# Patient Record
Sex: Male | Born: 1945 | Race: Black or African American | Hispanic: No | Marital: Married | Smoking: Former smoker
Health system: Southern US, Community
[De-identification: ages and names within clinical notes are randomized; demographics above are authoritative.]

## PROBLEM LIST (undated history)

## (undated) DIAGNOSIS — I10 Essential (primary) hypertension: Secondary | ICD-10-CM

## (undated) DIAGNOSIS — I4891 Unspecified atrial fibrillation: Secondary | ICD-10-CM

## (undated) DIAGNOSIS — I5022 Chronic systolic (congestive) heart failure: Secondary | ICD-10-CM

## (undated) HISTORY — DX: Essential (primary) hypertension: I10

## (undated) HISTORY — DX: Chronic systolic (congestive) heart failure: I50.22

## (undated) HISTORY — DX: Unspecified atrial fibrillation: I48.91

---

## 2010-08-22 ENCOUNTER — Inpatient Hospital Stay: Payer: Self-pay | Admitting: Internal Medicine

## 2010-08-22 DIAGNOSIS — R079 Chest pain, unspecified: Secondary | ICD-10-CM

## 2010-08-22 DIAGNOSIS — I4891 Unspecified atrial fibrillation: Secondary | ICD-10-CM

## 2010-08-30 ENCOUNTER — Encounter: Payer: Self-pay | Admitting: Cardiovascular Disease

## 2010-08-30 ENCOUNTER — Ambulatory Visit (INDEPENDENT_AMBULATORY_CARE_PROVIDER_SITE_OTHER): Payer: PRIVATE HEALTH INSURANCE | Admitting: Cardiovascular Disease

## 2010-08-30 DIAGNOSIS — I4891 Unspecified atrial fibrillation: Secondary | ICD-10-CM

## 2010-08-30 DIAGNOSIS — I428 Other cardiomyopathies: Secondary | ICD-10-CM

## 2010-08-30 DIAGNOSIS — I10 Essential (primary) hypertension: Secondary | ICD-10-CM

## 2010-08-30 MED ORDER — AMLODIPINE BESYLATE 10 MG PO TABS: 10.0000 mg | ORAL_TABLET | Freq: Every day | ORAL | Status: AC

## 2010-08-30 MED ORDER — LISINOPRIL 40 MG PO TABS: 40.0000 mg | ORAL_TABLET | Freq: Every day | ORAL | Status: AC

## 2010-08-30 NOTE — Assessment & Plan Note (Signed)
He is maintaining normal sinus rhythm today. He has not appreciated any palpitations. We will continue metoprolol b.i.d.. Discharge notes from hospital do not indicate if he is on tartrate or succinate. We will try to confirm this for our records.  If he continues to hold normal sinus rhythmAfter improvement of his blood pressure, we could consider holding anticoagulation.

## 2010-08-30 NOTE — Assessment & Plan Note (Signed)
Blood pressure is very elevated on today's visit. We will increase lisinopril to 20 mg daily with a check of his blood pressure appeared if it continues to be elevated, we have suggested he increase to 40 mg within several days. We have also provided a prescription for amlodipine 10 mg daily to be taken if his systolic pressures continued to be greater than 140 on a regular basis. He has followup with his new primary care physician in [place]).

## 2010-08-30 NOTE — Patient Instructions (Addendum)
Stay on metoprolol twice a day Please stop the low dose lisinopril Start lisinopril higher dose (you could start a 1/2 pill for a few days and check your blood pressure). If top number is more than 140, then go to the full pill of lisinopril. Continue to check your blood pressure.  If you have top number greater than 140, start the amlodipine one a day Please call us if you have new issues that need to be addressed before your next appt.  We will call you for a follow up Appt. In 3 months

## 2010-08-30 NOTE — Progress Notes (Addendum)
Patient ID: Randy Morris, male    DOB: 1945/12/17, 65 y.o.   MRN: [id number]  HPI Comments: Randy Morris is a pleasant 65 year old gentleman with history of hypertension, recently presented to [facility] on August 22 2010 with chest discomfort and rapid atrial fibrillation. He was started on Cardizem infusion with improvement of his blood pressure and heart rate, conversion to normal sinus rhythm overnight. Additional workup included echocardiogram and Myoview. He presents today to establish care in the office in for evaluation of his medications and blood pressure post hospital.  Overall he reports that he is doing well. He does have mild malaise but is unable to pinpoint the cause. He is uncertain if it is from a medication. He has not been checking his blood pressure at home. He denies any further episodes of chest pain. He does have some discomfort in the left breast with palpation. At times. This comes on at rest   Echocardiogram shows ejection fraction 45-50%, diastolic dysfunction, mild LVH. Normal left atrial size. Normal right ventricular systolic pressures  Stress test shows no significant ischemia. Very small perfusion defect in the distal anterior wall. Ejection fraction 44%. Low risk scan. Patient was in normal sinus rhythm for the test 08/23/2010.  EKG shows normal sinus rhythm with rate 77 beats per minute, no significant ST or T wave changes, biatrial enlargement    Outpatient Encounter Prescriptions as of 08/30/2010  Medication Sig Dispense Refill  . acetaminophen (TYLENOL) 325 MG tablet Take 650 mg by mouth every 6 (six) hours as needed.        . dabigatran (PRADAXA) 150 MG CAPS Take 150 mg by mouth every 12 (twelve) hours.        . metoprolol succinate (TOPROL-XL) 25 MG 24 hr tablet Take 25 mg by mouth 2 (two) times daily.        Marland Kitchen  lisinopril (PRINIVIL,ZESTRIL) 5 MG tablet Take 5 mg by mouth daily.           Review of Systems  Constitutional: Negative.   HENT: Negative.   Eyes:  Negative.   Respiratory: Negative.   Cardiovascular: Negative.   Gastrointestinal: Negative.   Musculoskeletal: Negative.   Skin: Negative.   Neurological: Negative.   Hematological: Negative.   Psychiatric/Behavioral: Negative.   All other systems reviewed and are negative.    BP 180/98  Pulse 77  Ht 6\' 2"  (1.88 m)  Wt 226 lb (102.513 kg)  BMI 29.02 kg/m2  Physical Exam  Nursing note and vitals reviewed. Constitutional: He is oriented to person, place, and time. He appears well-developed and well-nourished.  HENT:  Head: Normocephalic.  Nose: Nose normal.  Mouth/Throat: Oropharynx is clear and moist.  Eyes: Conjunctivae are normal. Pupils are equal, round, and reactive to light.  Neck: Normal range of motion. Neck supple. No JVD present.  Cardiovascular: Normal rate, regular rhythm, S1 normal, S2 normal, normal heart sounds and intact distal pulses.  Exam reveals no gallop and no friction rub.   No murmur heard. Pulmonary/Chest: Effort normal and breath sounds normal. No respiratory distress. He has no wheezes. He has no rales. He exhibits no tenderness.  Abdominal: Soft. Bowel sounds are normal. He exhibits no distension. There is no tenderness.  Musculoskeletal: Normal range of motion. He exhibits no edema and no tenderness.  Lymphadenopathy:    He has no cervical adenopathy.  Neurological: He is alert and oriented to person, place, and time. Coordination normal.  Skin: Skin is warm and dry. No rash noted. No  erythema.  Psychiatric: He has a normal mood and affect. His behavior is normal. Judgment and thought content normal.           Assessment and Plan

## 2010-08-30 NOTE — Assessment & Plan Note (Signed)
Ejection fraction is mildly decreased at 45%. This could be secondary to long-standing hypertension. Unable to exclude changes from recent atrial fibrillation. He does have diastolic dysfunction and mild LVH, again likely from chronic hypertension.

## 2010-12-04 ENCOUNTER — Ambulatory Visit: Payer: PRIVATE HEALTH INSURANCE | Admitting: Cardiovascular Disease

## 2020-09-14 ENCOUNTER — Other Ambulatory Visit: Payer: Self-pay

## 2020-09-14 ENCOUNTER — Ambulatory Visit
Admission: EM | Admit: 2020-09-14 | Discharge: 2020-09-14 | Disposition: A | Discharge status: Home / Self Care | Payer: Medicare Other | Attending: Family Medicine | Admitting: Family Medicine

## 2020-09-14 ENCOUNTER — Ambulatory Visit (INDEPENDENT_AMBULATORY_CARE_PROVIDER_SITE_OTHER): Payer: Medicare Other

## 2020-09-14 DIAGNOSIS — M25531 Pain in right wrist: Secondary | ICD-10-CM | POA: Diagnosis not present

## 2020-09-14 MED ORDER — PREDNISONE 10 MG (21) PO TBPK: ORAL_TABLET | ORAL | 0 refills | Status: AC

## 2020-09-14 NOTE — Discharge Instructions (Addendum)
Rest, ice.  Medication as prescribed.  Take care  Dr. Samwise Eckardt  

## 2020-09-14 NOTE — ED Provider Notes (Signed)
[facility]    CSN: [id number] Arrival date & time: 09/14/20  1147      History   Chief Complaint Wrist pain  HPI 75 year old male presents with right wrist pain.  Started 2 to 3 weeks ago.  Improved with over-the-counter treatment and then recurred again last night.  He states that his pain was severe last night and has continued today.  Pain 8/10 in severity.  No known inciting factor.  He states that he is very active and does a lot of pull-ups and push-ups.  He also states that it few weeks ago he "caught himself" with his right wrist.  Denies fall.  No bruising.  He states that the wrist is swollen.  He has tried over-the-counter medication both topical and oral without relief.  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 Authorizing Provider  acetaminophen (TYLENOL) 325 MG tablet Take 650 mg by mouth every 6 (six) hours as needed.     Yes [provider]  predniSONE (STERAPRED UNI-PAK 21 TAB) 10 MG (21) TBPK tablet 6 tablets on day 1; decrease by 1 tablet daily until gone. 09/14/20  Yes Reann Dobias G, DO  amLODipine (NORVASC) 10 MG tablet Take 1 tablet (10 mg total) by mouth daily. 08/30/10 08/30/11  Antonieta Iba, MD  lisinopril (PRINIVIL,ZESTRIL) 40 MG tablet Take 1 tablet (40 mg total) by mouth daily. 08/30/10 08/30/11  Antonieta Iba, MD   Social History Social History   Tobacco Use   Smoking status: Former    Packs/day: 1.00    Years: 10.00    Pack years: 10.00    Types: Cigarettes    Quit date: 02/20/1980    Years since quitting: 40.5   Smokeless tobacco: Never  Substance Use Topics    Alcohol use: No   Drug use: No     Allergies   Patient has no known allergies.   Review of Systems Review of Systems Per HPI   Physical Exam Triage Vital Signs ED Triage Vitals  Enc Vitals Group     BP --      Pulse Rate 09/14/20 1213 (!) 104     Resp 09/14/20 1213 18     Temp 09/14/20 1213 97.8 F (36.6 C)     Temp src --      SpO2 09/14/20 1213 95 %     Weight 09/14/20 1212 230 lb (104.3 kg)     Height 09/14/20 1212 6\' 2"  (1.88 m)     Head Circumference --      Peak Flow --      Pain Score 09/14/20 1211 8     Pain Loc --      Pain Edu? --      Excl. in GC? --    Updated Vital Signs Pulse (!) 104   Temp 97.8 F (36.6 C)   Resp 18   Ht 6\' 2"  (1.88 m)   Wt 104.3 kg   SpO2 95%   BMI 29.53 kg/m   Visual Acuity Right Eye Distance:   Left Eye Distance:  Bilateral Distance:    Right Eye Near:   Left Eye Near:    Bilateral Near:     Physical Exam Vitals and nursing note reviewed.  Constitutional:      General: He is not in acute distress.    Appearance: Normal appearance. He is not ill-appearing.  HENT:     Head: Normocephalic and atraumatic.  Pulmonary:     Effort: Pulmonary effort is normal. No respiratory distress.  Musculoskeletal:     Comments: Right wrist -tenderness over the dorsum of the right wrist.  Mild erythema.  Mild swelling.  Neurological:     Mental Status: He is alert.  Psychiatric:        Mood and Affect: Mood normal.        Behavior: Behavior normal.    UC Treatments / Results  Labs (all labs ordered are listed, but only abnormal results are displayed) Labs Reviewed - No data to display  EKG   Radiology DG Wrist Complete Right  Result Date: 09/14/2020 CLINICAL DATA:  Wrist pain EXAM: RIGHT WRIST - COMPLETE 3+ VIEW COMPARISON:  None. FINDINGS: Tiny osseous density along the dorsal aspect of the proximal carpal row seen on lateral view suggestive of a triquetral avulsion fracture, age indeterminate. Elsewhere, no additional  fracture. No malalignment. Small subcortical lucency along the ulnar aspect of the lunate which may reflect a subchondral cyst or intraosseous ganglion. Joint spaces are relatively well preserved. Mild soft tissue swelling about the wrist. IMPRESSION: 1. Tiny osseous density along the dorsal aspect of the proximal carpal row seen on lateral view suggestive of a triquetral avulsion fracture, age indeterminate. Correlation with point tenderness at this site is recommended. 2. Otherwise, no acute findings. Electronically Signed   By: Duanne Guess D.O.   On: 09/14/2020 13:25    Procedures Procedures (including critical care time)  Medications Ordered in UC Medications - No data to display  Initial Impression / Assessment and Plan / UC Course  I have reviewed the triage vital signs and the nursing notes.  Pertinent labs & imaging results that were available during my care of the patient were reviewed by me and considered in my medical decision making (see chart for details).    75 year old male presents with right wrist pain.  X-ray was obtained.  I did not appreciate any acute fracture.  There was a tiny osseous density that radiology commented about as a possible triquetral avulsion.  Age-indeterminate.  Advise rest, ice, elevation.  Prednisone as prescribed.  Final Clinical Impressions(s) / UC Diagnoses   Final diagnoses:  Right wrist pain     Discharge Instructions      Rest, ice.  Medication as prescribed.  Take care  Dr. Adriana Simas    ED Prescriptions     Medication Sig Dispense Auth. Provider   predniSONE (STERAPRED UNI-PAK 21 TAB) 10 MG (21) TBPK tablet 6 tablets on day 1; decrease by 1 tablet daily until gone. 21 tablet Everlene Other G, DO      PDMP not reviewed this encounter.   Tommie Sams, [place] 09/14/20 1354

## 2020-09-14 NOTE — ED Triage Notes (Signed)
Pt here with C/O right wrist pain, for a few weeks ago, was able to [place] a cream and it went away, started again last night. Pt took difulecnec this morning with little relief. Does remember a fall a couple weeks ago and he caought himseld with hi right hand.

## 2020-12-20 ENCOUNTER — Other Ambulatory Visit: Payer: Self-pay | Admitting: Orthopedic Surgery

## 2020-12-20 DIAGNOSIS — S63501A Unspecified sprain of right wrist, initial encounter: Secondary | ICD-10-CM

## 2020-12-20 DIAGNOSIS — M25331 Other instability, right wrist: Secondary | ICD-10-CM

## 2020-12-20 DIAGNOSIS — M25541 Pain in joints of right hand: Secondary | ICD-10-CM

## 2020-12-27 ENCOUNTER — Ambulatory Visit
Admission: RE | Admit: 2020-12-27 | Discharge: 2020-12-27 | Disposition: A | Discharge status: Home / Self Care | Payer: Medicare Other | Source: Ambulatory Visit | Attending: Orthopedic Surgery | Admitting: Orthopedic Surgery

## 2020-12-27 ENCOUNTER — Other Ambulatory Visit: Payer: Self-pay

## 2020-12-27 DIAGNOSIS — M25331 Other instability, right wrist: Secondary | ICD-10-CM

## 2020-12-27 DIAGNOSIS — S63501A Unspecified sprain of right wrist, initial encounter: Secondary | ICD-10-CM | POA: Diagnosis present

## 2020-12-27 DIAGNOSIS — M25541 Pain in joints of right hand: Secondary | ICD-10-CM | POA: Diagnosis present

## 2022-07-14 IMAGING — MR MR WRIST*R* W/O CM
5 of 6 series · 28 of 40 positions shown · non-contrast
Comparison: X-ray wrist 09/14/2020.

CLINICAL DATA: Right medial wrist pain with swelling, but improving
since [REDACTED] related to a fall.

EXAM:
MR OF THE RIGHT WRIST WITHOUT CONTRAST
TECHNIQUE: Multiplanar, multisequence MR imaging of the right wrist was
performed. No intravenous contrast was administered.

[Series 3: T2 fat-sat · axial · 3.0mm · 0.39mm/px · z∈[-55,+29]mm · 7 of 27 slices shown (1 of 2)]
[im 1/27]
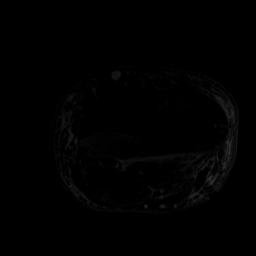
[im 5/27]
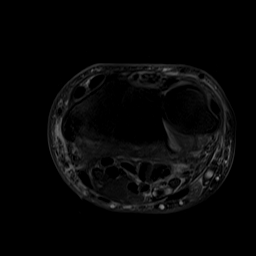
[im 9/27]
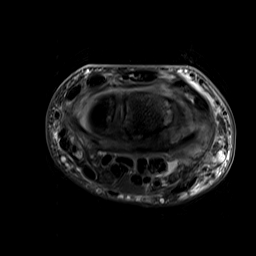
[im 14/27]
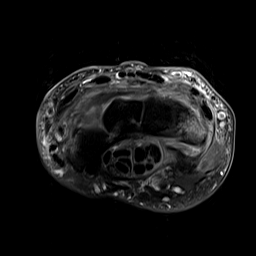
[im 18/27]
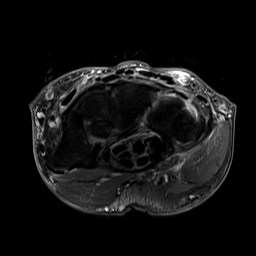
[im 22/27]
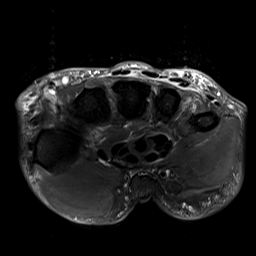
[im 27/27]
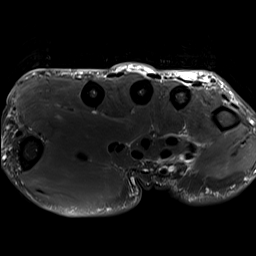

[Series 4: T1 · axial · 3.0mm · 0.31mm/px · 1 of 27 slices shown]
[im 1/27]
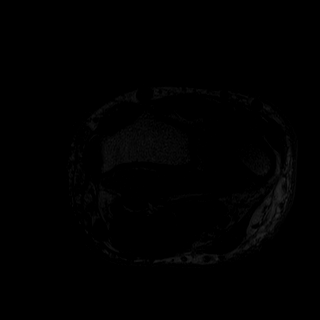

[Series 6: T2 fat-sat · coronal · 3.0mm · 0.39mm/px · 6 of 20 slices shown (2 of 2)]
[im 1/20]
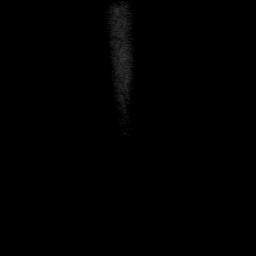
[im 4/20]
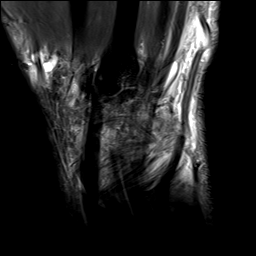
[im 8/20]
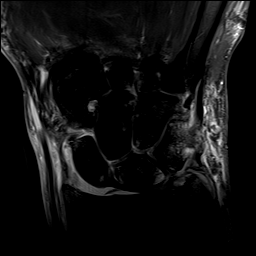
[im 12/20]
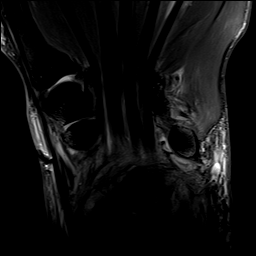
[im 16/20]
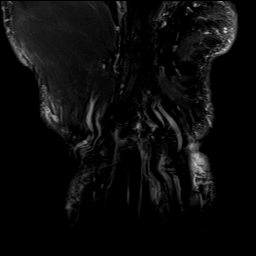
[im 20/20]
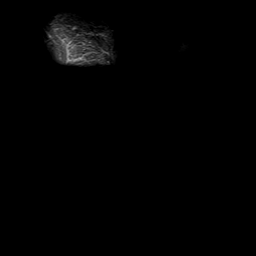

[Series 7: PD fat-sat · coronal · 3.0mm · 0.20mm/px · 6 of 20 slices shown (1 of 2)]
[im 1/20]
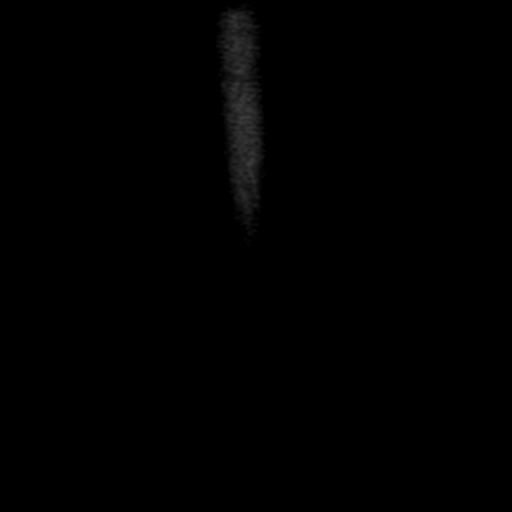
[im 4/20]
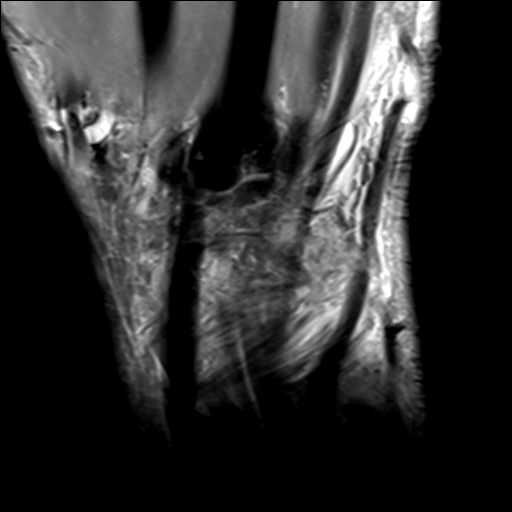
[im 8/20]
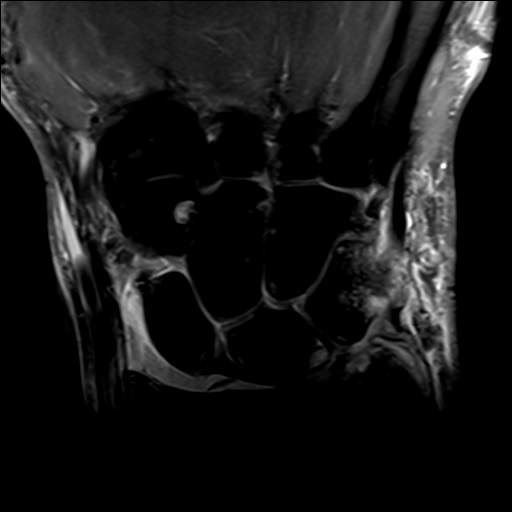
[im 12/20]
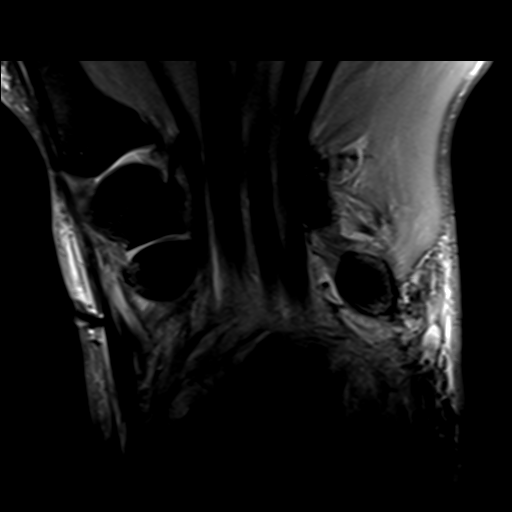
[im 16/20]
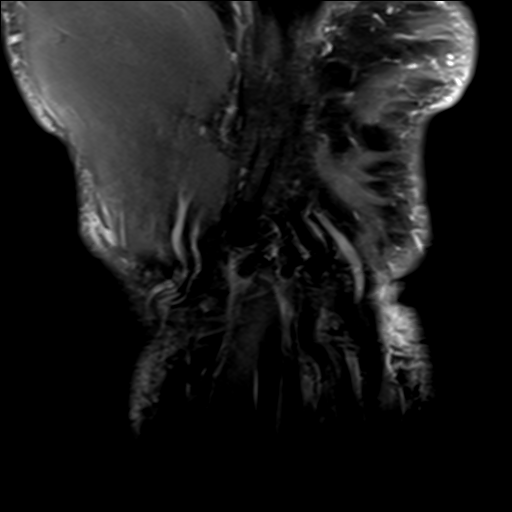
[im 20/20]
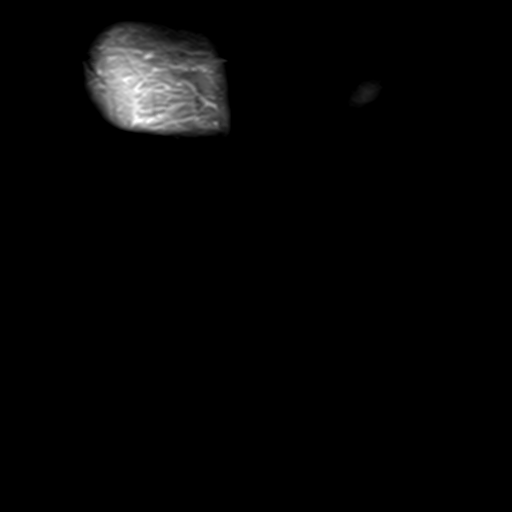

[Series 8: PD fat-sat · sagittal · 3.0mm · 0.20mm/px · 8 of 27 slices shown (2 of 2)]
[im 1/27]
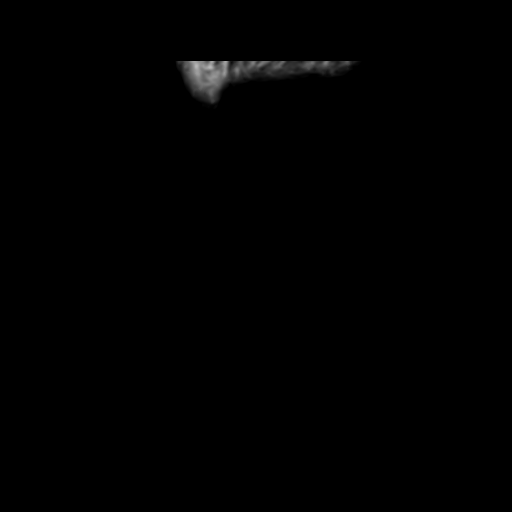
[im 4/27]
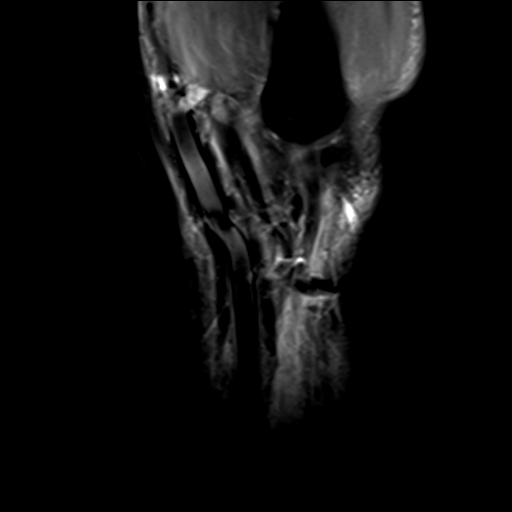
[im 8/27]
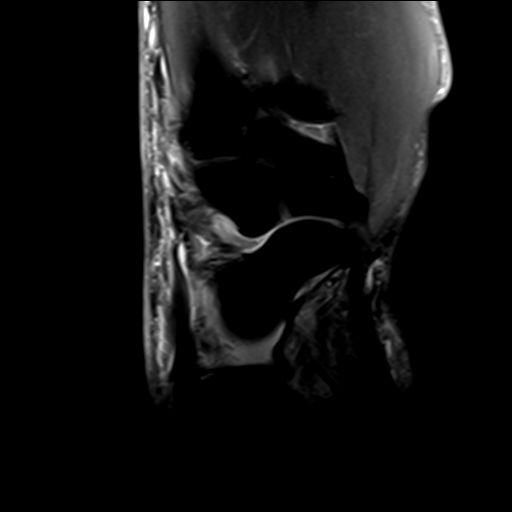
[im 12/27]
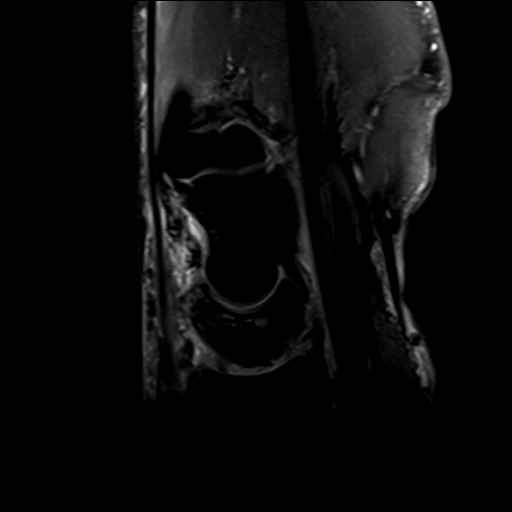
[im 15/27]
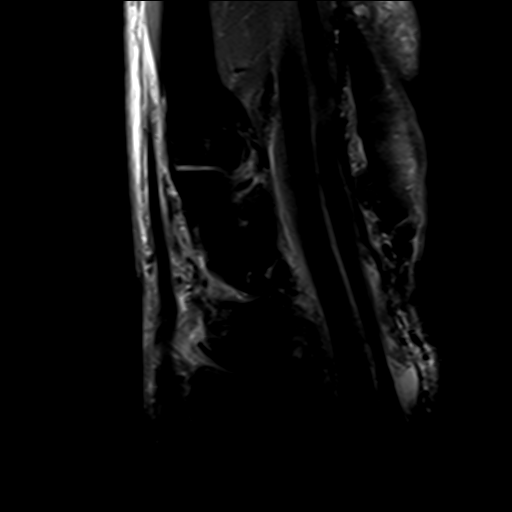
[im 19/27]
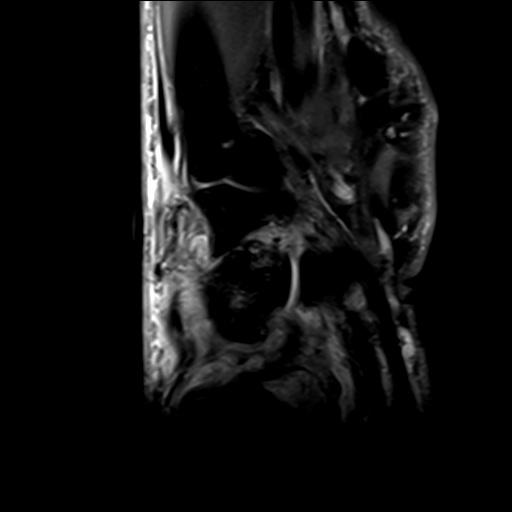
[im 23/27]
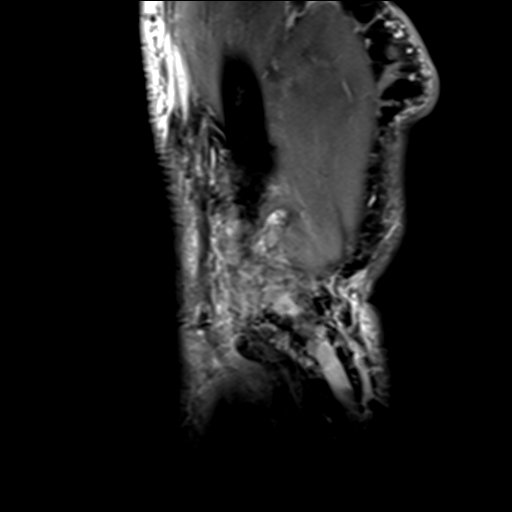
[im 27/27]
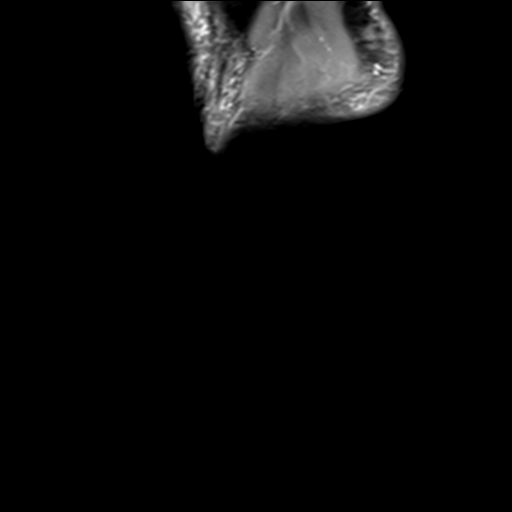

[28 of 40 positions shown; findings below may reference images not displayed]

FINDINGS: Ligaments: The dorsal and intermediate zone components of the
scapholunate ligament are thickened but intact. The volar band
appears somewhat indistinct. The lunotriquetral ligament is favored
to be intact.

Triangular fibrocartilage: Central tear of the TFCC articular disc.

Tendons: There is mild tenosynovitis of the second, third, and
fourth extensor compartments. No acute tendon tear. There is
tendinosis of the flexor digitorum profundus tendon of the ring
finger within the carpal tunnel.

Carpal tunnel/median nerve: Flexor retinaculum is intact. Normal
carpal tunnel without a mass. Median nerve demonstrates normal
signal and caliber.

Guyon's canal: There is mild thickening and increased signal of the
ulnar nerve within Guyon's canal. There is no evidence of: Canal
mass.

Joint/cartilage: There is effusion throughout the radiocarpal,
intercarpal, and DRUJ.

Bones/carpal alignment: There are multiple subchondral cyst or bony
erosions of the carpal bones and periarticular marrow edema. Marrow
edema is most prominent within the lunate and the triquetrum.

Other: There is diffuse soft tissue swelling along the wrist. No
focal fluid collection or mass.
IMPRESSION: Multiple subchondral cysts or erosions throughout the carpal bones,
with multifocal bony edema most prominent in the lunate and
triquetrum. Radiocarpal, intercarpal, and DRUJ effusion. Findings
could reflect recent ulnar-sided trauma with underlying diffuse
wrist arthritis, possibly inflammatory.

Central tear of the TFCC articular disc. Indistinct appearance of
the volar band of the scapholunate ligament, likely degenerative.
The intermediate and dorsal components are thickened but intact.

Mild tenosynovitis of the second, third, and fourth extensor
compartments.

Mild thickening and increased signal of the ulnar nerve within
Guyon's canal without evidence of glands canal mass. Correlate with
symptoms of ulnar neuropathy in the hand.

Tendinosis of the flexor digitorum profundus tendon of the ring
finger.
